# Patient Record
Sex: Female | Born: 2000 | Race: Black or African American | Hispanic: No | Marital: Single | Smoking: Never smoker
Health system: Southern US, Community
[De-identification: ages and names within clinical notes are randomized; demographics above are authoritative.]

## PROBLEM LIST (undated history)

## (undated) DIAGNOSIS — R519 Headache, unspecified: Secondary | ICD-10-CM

## (undated) DIAGNOSIS — L509 Urticaria, unspecified: Secondary | ICD-10-CM

## (undated) DIAGNOSIS — L709 Acne, unspecified: Secondary | ICD-10-CM

## (undated) DIAGNOSIS — G43909 Migraine, unspecified, not intractable, without status migrainosus: Secondary | ICD-10-CM

## (undated) DIAGNOSIS — Q699 Polydactyly, unspecified: Secondary | ICD-10-CM

## (undated) DIAGNOSIS — G8929 Other chronic pain: Secondary | ICD-10-CM

## (undated) HISTORY — DX: Migraine, unspecified, not intractable, without status migrainosus: G43.909

## (undated) HISTORY — DX: Headache, unspecified: R51.9

## (undated) HISTORY — DX: Other chronic pain: G89.29

## (undated) HISTORY — DX: Urticaria, unspecified: L50.9

---
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
## 2018-08-27 ENCOUNTER — Other Ambulatory Visit: Payer: Self-pay | Admitting: *Deleted

## 2018-08-27 MED ORDER — LEVOCETIRIZINE DIHYDROCHLORIDE 5 MG PO TABS: 5.0000 mg | ORAL_TABLET | Freq: Every evening | ORAL | 0 refills | Status: DC

## 2018-09-17 ENCOUNTER — Ambulatory Visit: Payer: 59 | Admitting: Emergency Medicine

## 2018-09-17 ENCOUNTER — Other Ambulatory Visit: Payer: Self-pay

## 2018-09-17 ENCOUNTER — Encounter: Payer: Self-pay | Admitting: Emergency Medicine

## 2018-09-17 VITALS — BP 139/89 | HR 147 | Temp 97.6°F | Resp 16 | Ht 67.25 in | Wt 201.8 lb

## 2018-09-17 DIAGNOSIS — K219 Gastro-esophageal reflux disease without esophagitis: Secondary | ICD-10-CM | POA: Diagnosis not present

## 2018-09-17 DIAGNOSIS — M549 Dorsalgia, unspecified: Secondary | ICD-10-CM | POA: Diagnosis not present

## 2018-09-17 DIAGNOSIS — Z23 Encounter for immunization: Secondary | ICD-10-CM | POA: Diagnosis not present

## 2018-09-17 MED ORDER — FAMOTIDINE 40 MG PO TABS: 40.0000 mg | ORAL_TABLET | Freq: Every day | ORAL | 1 refills | Status: DC

## 2018-09-17 MED ORDER — OMEPRAZOLE 40 MG PO CPDR: 40.0000 mg | DELAYED_RELEASE_CAPSULE | Freq: Every day | ORAL | 3 refills | Status: DC

## 2018-09-17 NOTE — Patient Instructions (Addendum)
   If you have lab work done today you will be contacted with your lab results within the next 2 weeks.  If you have not heard from us then please contact us. The fastest way to get your results is to register for My Chart.   IF you received an x-ray today, you will receive an invoice from [facility]. Please contact [facility] at [phone] with questions or concerns regarding your invoice.   IF you received labwork today, you will receive an invoice from [facility]. Please contact [facility] at [phone] with questions or concerns regarding your invoice.   Our billing staff will not be able to assist you with questions regarding bills from these companies.  You will be contacted with the lab results as soon as they are available. The fastest way to get your results is to activate your My Chart account. Instructions are located on the last page of this paperwork. If you have not heard from us regarding the results in 2 weeks, please contact this office.     Food Choices for Gastroesophageal Reflux Disease, Adult When you have gastroesophageal reflux disease (GERD), the foods you eat and your eating habits are very important. Choosing the right foods can help ease your discomfort. Think about working with a nutrition specialist (dietitian) to help you make good choices. What are tips for following this plan?  Meals  Choose healthy foods that are low in fat, such as fruits, vegetables, whole grains, low-fat dairy products, and lean meat, fish, and poultry.  Eat small meals often instead of 3 large meals a day. Eat your meals slowly, and in a place where you are relaxed. Avoid bending over or lying down until 2-3 hours after eating.  Avoid eating meals 2-3 hours before bed.  Avoid drinking a lot of liquid with meals.  Cook foods using methods other than frying. Bake, grill, or broil food instead.  Avoid or limit: ? Chocolate. ? Peppermint or  spearmint. ? Alcohol. ? Pepper. ? Black and decaffeinated coffee. ? Black and decaffeinated tea. ? Bubbly (carbonated) soft drinks. ? Caffeinated energy drinks and soft drinks.  Limit high-fat foods such as: ? Fatty meat or fried foods. ? Whole milk, cream, butter, or ice cream. ? Nuts and nut butters. ? Pastries, donuts, and sweets made with butter or shortening.  Avoid foods that cause symptoms. These foods may be different for everyone. Common foods that cause symptoms include: ? Tomatoes. ? Oranges, lemons, and limes. ? Peppers. ? Spicy food. ? Onions and garlic. ? Vinegar. Lifestyle  Maintain a healthy weight. Ask your doctor what weight is healthy for you. If you need to lose weight, work with your doctor to do so safely.  Exercise for at least 30 minutes for 5 or more days each week, or as told by your doctor.  Wear loose-fitting clothes.  Do not smoke. If you need help quitting, ask your doctor.  Sleep with the head of your bed higher than your feet. Use a wedge under the mattress or blocks under the bed frame to raise the head of the bed. Summary  When you have gastroesophageal reflux disease (GERD), food and lifestyle choices are very important in easing your symptoms.  Eat small meals often instead of 3 large meals a day. Eat your meals slowly, and in a place where you are relaxed.  Limit high-fat foods such as fatty meat or fried foods.  Avoid bending over or lying down until 2-3 hours after   eating.  Avoid peppermint and spearmint, caffeine, alcohol, and chocolate. This information is not intended to replace advice given to you by your health care provider. Make sure you discuss any questions you have with your health care provider. Document Released: 10/04/2011 Document Revised: 05/10/2016 Document Reviewed: 05/10/2016 Elsevier Interactive Patient Education  2019 [employer].

## 2018-09-17 NOTE — Progress Notes (Signed)
[address] 18 y.o.   Chief Complaint  Patient presents with  . Heartburn    x 1 month with upper back pain     HISTORY OF PRESENT ILLNESS: [name] complaining of chronic intermittent heartburn symptoms since last summer.  ED visit last July for GERD symptoms, started on omeprazole and Zantac.  Did well.  Symptoms recently started complaining of burning epigastric pain traveling up into her throat area.  Recently started on omeprazole 20 mg twice a day again with symptoms improvement but not completely gone.  Also has a history of seasonal allergies with chronic congestion .  Has been taking Allegra-D and nasal spray.  Denies any other significant symptoms.  Prior to symptom development she had been taking green tea which was felt to aggravate her symptoms.  Better since stopping.  Denies melena or rectal bleeding.  Able to drink and eat okay.  Denies nausea or vomiting.  Denies fever or flulike symptoms.  At times feels like heartburn pain radiates into her back.  Back pain is sharp and intermittent, worse with movement, at times exacerbated by working out.  HPI   Prior to Admission medications   Medication Sig Start Date End Date Taking? Authorizing Provider  levocetirizine (XYZAL) 5 MG tablet Take 1 tablet (5 mg total) by mouth every evening. 08/27/18  Yes Alfonse Spruce, MD  omeprazole (PRILOSEC) 20 MG capsule Take 1 capsule (20 mg total) by mouth daily. 10/25/17  Yes Wurst, [place], PA-C  albuterol (PROAIR HFA) 108 (90 Base) MCG/ACT inhaler Inhale 2 puffs into the lungs every 4 (four) hours as needed for wheezing or shortness of breath. Patient not taking: Reported on 09/17/2018 07/03/17   Alfonse Spruce, MD  ondansetron (ZOFRAN) 4 MG tablet Take 1 tablet (4 mg total) by mouth every 6 (six) hours. Patient not taking: Reported on 09/17/2018 10/25/17   Rennis Harding, PA-C    Allergies  Allergen Reactions  . Penicillins Hives and Shortness Of Breath     Patient Active Problem List   Diagnosis Date Noted  . Exercise-induced bronchospasm 06/30/2017  . Chronic urticaria 06/30/2017  . Seasonal and perennial allergic rhinitis 06/30/2017  . Frequent headaches 10/20/2013    Past Medical History:  Diagnosis Date  . Acne   . 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 Migraine   . Migraines   . Urticaria     Past Surgical History:  Procedure Laterality Date  . 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 FOOT SURGERY Right 2016    Social History   Socioeconomic History  . Marital status: Single    Spouse name: n/a  . Number of children: 0  . Years of education: Not on file  . Highest education level: Not on file  Occupational History  . 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 Financial resource strain: Not on file  . Food insecurity:    Worry: Not on file    Inability: Not on file  . Transportation needs:    Medical: Not on file    Non-medical: Not on file  Tobacco Use  . Smoking status: Never Smoker  . Smokeless tobacco: Never Used  . Tobacco comment: paternal grandmother smokes inside - pt. goes to grandmother's house before and after school  Substance and Sexual Activity  . Alcohol use: No  . Drug use: No  . Sexual activity: Not on file  Lifestyle  . Physical activity:    Days per week: Not on file    Minutes per session: Not on file  . Stress: Not on file  Relationships  . Social connections:    Talks on phone: Not on file    Gets together: Not on file    Attends religious service: Not on file    Active member of club or organization: Not on file    Attends meetings of clubs or organizations: Not on file    Relationship status: Not on file  . Intimate partner violence:    Fear of current or ex partner: Not on file     Emotionally abused: Not on file    Physically abused: Not on file    Forced sexual activity: Not on file  Other Topics Concern  . Not on file  Social History Narrative  . Not on file    Family History  Problem Relation Age of Onset  . Diabetes Maternal Grandmother   . Lung cancer Maternal Grandmother   . Diabetes Paternal Grandfather   . Kidney disease Paternal Grandfather   . Heart disease Maternal Grandfather   . Hypertension Paternal Grandmother      Review of Systems  Constitutional: Negative.  Negative for chills, fever and weight loss.  HENT: Positive for congestion. Negative for sore throat.   Respiratory: Negative.  Negative for cough and shortness of breath.   Cardiovascular: Negative for chest pain and palpitations.  Gastrointestinal: Positive for heartburn. Negative for abdominal pain, blood in stool, diarrhea, melena, nausea and vomiting.  Genitourinary: Negative.  Negative for dysuria.  Musculoskeletal: Negative.  Negative for myalgias.  Skin: Negative.  Negative for rash.  Neurological: Negative.  Negative for dizziness and headaches.  Endo/Heme/Allergies: Negative.   All other systems reviewed and are negative.  Vitals:   09/17/18 0832  BP: 139/89  Pulse: (!) 147  Resp: 16  Temp: 97.6 F (36.4 C)  SpO2: 98%   Repeat heart rate 100 and regular.  Physical Exam Vitals signs reviewed.  Constitutional:      Appearance: Normal appearance.  HENT:     Head: Normocephalic and atraumatic.     Nose: Nose normal.     Mouth/Throat:     Mouth: Mucous membranes are moist.     Pharynx: Oropharynx is clear.  Eyes:     Extraocular Movements: Extraocular movements intact.     Conjunctiva/sclera: Conjunctivae normal.     Pupils: Pupils are equal, round, and reactive to light.  Neck:     Musculoskeletal: Normal range of motion and neck supple.  Cardiovascular:     Rate and Rhythm: Normal rate and regular rhythm.     Pulses: Normal pulses.     Heart sounds:  Normal heart sounds.  Pulmonary:     Effort: Pulmonary effort is normal.     Breath sounds: Normal breath sounds.  Abdominal:     General: There is no distension.     Palpations: Abdomen is soft. There is no mass.     Tenderness: There is no abdominal tenderness. There is no guarding.  Musculoskeletal: Normal range of motion.        General: No swelling or tenderness.     Cervical back: Normal.     Thoracic back: Normal.     Lumbar back: Normal.     Right lower leg:  No edema.     Left lower leg: No edema.  Skin:    General: Skin is warm and dry.     Capillary Refill: Capillary refill takes less than 2 seconds.  Neurological:     General: No focal deficit present.     Mental Status: She is alert and oriented to person, place, and time.     Sensory: No sensory deficit.     Motor: No weakness.  Psychiatric:        Mood and Affect: Mood normal.        Behavior: Behavior normal.      ASSESSMENT & PLAN: Donna Cabrera was seen today for heartburn.  Diagnoses and all orders for this visit:  Gastroesophageal reflux disease without esophagitis -     omeprazole (PRILOSEC) 40 MG capsule; Take 1 capsule (40 mg total) by mouth daily. -     famotidine (PEPCID) 40 MG tablet; Take 1 tablet (40 mg total) by mouth daily for 14 days.  Need for prophylactic vaccination and inoculation against cholera alone -     Tdap vaccine greater than or equal to 7yo IM  Upper back pain Comments: Musculoskeletal    Patient Instructions       If you have lab work done today you will be contacted with your lab results within the next 2 weeks.  If you have not heard from us then please contact us. The fastest way to get your results is to register for My Chart.   IF you received an x-ray today, you will receive an invoice [facility]. Please contact [facility] at [phone] with questions or concerns regarding your invoice.   IF you received labwork today, you will receive an  invoice from [facility]. Please contact [facility] at [phone] with questions or concerns regarding your invoice.   Our billing staff will not be able to assist you with questions regarding bills from these companies.  You will be contacted with the lab results as soon as they are available. The fastest way to get your results is to activate your My Chart account. Instructions are located on the last page of this paperwork. If you have not heard from us regarding the results in 2 weeks, please contact this office.     Food Choices for Gastroesophageal Reflux Disease, Adult When you have gastroesophageal reflux disease (GERD), the foods you eat and your eating habits are very important. Choosing the right foods can help ease your discomfort. Think about working with a nutrition specialist (dietitian) to help you make good choices. What are tips for following this plan?  Meals  Choose healthy foods that are low in fat, such as fruits, vegetables, whole grains, low-fat dairy products, and lean meat, fish, and poultry.  Eat small meals often instead of 3 large meals a day. Eat your meals slowly, and in a place where you are relaxed. Avoid bending over or lying down until 2-3 hours after eating.  Avoid eating meals 2-3 hours before bed.  Avoid drinking a lot of liquid with meals.  Cook foods using methods other than frying. Bake, grill, or broil food instead.  Avoid or limit: ? Chocolate. ? Peppermint or spearmint. ? Alcohol. ? Pepper. ? Black and decaffeinated coffee. ? Black and decaffeinated tea. ? Bubbly (carbonated) soft drinks. ? Caffeinated energy drinks and soft drinks.  Limit high-fat foods such as: ? Fatty meat or fried foods. ? Whole milk, cream, butter, or ice cream. ? Nuts and nut butters. ?  Pastries, donuts, and sweets made with butter or shortening.  Avoid foods that cause symptoms. These foods may be different for everyone. Common foods that cause symptoms include:  ? Tomatoes. ? Oranges, lemons, and limes. ? Peppers. ? Spicy food. ? Onions and garlic. ? Vinegar. Lifestyle  Maintain a healthy weight. Ask your doctor what weight is healthy for you. If you need to lose weight, work with your doctor to do so safely.  Exercise for at least 30 minutes for 5 or more days each week, or as told by your doctor.  Wear loose-fitting clothes.  Do not smoke. If you need help quitting, ask your doctor.  Sleep with the head of your bed higher than your feet. Use a wedge under the mattress or blocks under the bed frame to raise the head of the bed. Summary  When you have gastroesophageal reflux disease (GERD), food and lifestyle choices are very important in easing your symptoms.  Eat small meals often instead of 3 large meals a day. Eat your meals slowly, and in a place where you are relaxed.  Limit high-fat foods such as fatty meat or fried foods.  Avoid bending over or lying down until 2-3 hours after eating.  Avoid peppermint and spearmint, caffeine, alcohol, and chocolate. This information is not intended to replace advice given to you by your health care provider. Make sure you discuss any questions you have with your health care provider. Document Released: 10/04/2011 Document Revised: 05/10/2016 Document Reviewed: 05/10/2016 Elsevier Interactive Patient Education  2019 [employer]      Edwina Barth, MD [facility]

## 2018-09-19 ENCOUNTER — Other Ambulatory Visit: Payer: Self-pay | Admitting: Allergy & Immunology

## 2018-11-01 ENCOUNTER — Encounter: Payer: Self-pay | Admitting: Allergy & Immunology

## 2018-11-01 ENCOUNTER — Ambulatory Visit: Payer: 59 | Admitting: Allergy & Immunology

## 2018-11-01 ENCOUNTER — Other Ambulatory Visit: Payer: Self-pay

## 2018-11-01 VITALS — BP 110/80 | HR 116 | Temp 97.3°F | Resp 16 | Ht 66.0 in | Wt 198.0 lb

## 2018-11-01 DIAGNOSIS — J45990 Exercise induced bronchospasm: Secondary | ICD-10-CM

## 2018-11-01 DIAGNOSIS — J302 Other seasonal allergic rhinitis: Secondary | ICD-10-CM

## 2018-11-01 DIAGNOSIS — T7800XD Anaphylactic reaction due to unspecified food, subsequent encounter: Secondary | ICD-10-CM | POA: Diagnosis not present

## 2018-11-01 DIAGNOSIS — K219 Gastro-esophageal reflux disease without esophagitis: Secondary | ICD-10-CM

## 2018-11-01 DIAGNOSIS — J3089 Other allergic rhinitis: Secondary | ICD-10-CM

## 2018-11-01 DIAGNOSIS — L508 Other urticaria: Secondary | ICD-10-CM

## 2018-11-01 MED ORDER — ALBUTEROL SULFATE HFA 108 (90 BASE) MCG/ACT IN AERS: 2.0000 | INHALATION_SPRAY | RESPIRATORY_TRACT | 1 refills | Status: DC | PRN

## 2018-11-01 MED ORDER — LEVOCETIRIZINE DIHYDROCHLORIDE 5 MG PO TABS: 5.0000 mg | ORAL_TABLET | Freq: Every evening | ORAL | 0 refills | Status: DC

## 2018-11-01 MED ORDER — MONTELUKAST SODIUM 10 MG PO TABS: 10.0000 mg | ORAL_TABLET | Freq: Every day | ORAL | 5 refills | Status: DC

## 2018-11-01 NOTE — Progress Notes (Signed)
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 Exercise-induced bronchospasm - Lung testing looked good today.  - We are going to restart the montelukast to see if this will help with the shortness of breath.  - - Daily controller medication(s): Singulair 10mg  daily - Prior to physical activity: ProAir 2 puffs 10-15 minutes before physical activity. - Rescue medications: ProAir 4 puffs every 4-6 hours as needed - Asthma control goals:  * Full participation in all desired activities (may need albuterol before activity) * Albuterol use two time or less a week on average (not counting use with activity) * Cough interfering with sleep two time or less a month * Oral steroids no more than once a year * No hospitalizations   2. Chronic urticaria - controlled with antihistamines - Workup last year was normal aside from the tree nut allergies. - In the meantime, continue with suppressive dosing of antihistamines:   - Morning: Xyzal (levocetirizine) 5mg  (one tablets)  - Evening: Zyrtec (cetirizine) 10mg  (one tablets) - You can change this dosing at home, decreasing the dose as needed or increasing the dosing as needed.  - If you are not tolerating the medications or are tired of taking them every day, we can start treatment with a monthly injectable medication called Xolair.    3. Perennial and seasonal allergic rhinitis (trees, weeds, grasses, dust mites, cat and cockroach) - Your throat clearly showed uncontrolled postnasal drip. - Restarting the nose spray and being consistent with it will help.  - Allergy shots would also help ( - Continue taking: Nasacort (triamcinolone) one spray per nostril daily (plus the antihistamines for the  chronic hives) - You can use an extra dose of the antihistamine, if needed, for breakthrough symptoms.  - Consider nasal saline rinses 1-2 times daily to remove allergens from the nasal cavities as well as help with mucous clearance (this is especially helpful to do before the nasal sprays are given) - Consider allergy shots as a means of long-term control. - Allergy shots "re-train" and "reset" the immune system to ignore environmental allergens and decrease the resulting immune response to those allergens (sneezing, itchy watery eyes, runny nose, nasal congestion, etc).    - Allergy shots improve symptoms in 75-85% of patients.   4. Chest discomfort  - I would definitely make an appointment with your GI doctor again. - You could have something called eosinophilic esophagitis (information provided). - The thing that makes me concerned with this is the fact that your symptoms are consistently during the same time of the year.   5. Return in about 2 months (around 01/02/2019). This can be an in-person, a virtual Webex or a telephone follow up visit.   Subjective:   Donna Cabrera is a 18 y.o. female presenting today for follow up of  Chief Complaint  Patient presents with   Urticaria    broke out last week, after being outside   Exercise induced bronchospasm    having issues when the weather quality is bad, chest feels tight, dust issues    Donna Cabrera has a history of the following: Patient Active Problem List   Diagnosis Date Noted   Exercise-induced bronchospasm 06/30/2017   Chronic urticaria 06/30/2017   Seasonal and perennial allergic rhinitis 06/30/2017  Frequent headaches 10/20/2013    History obtained from: chart review and patient.  Donna Cabrera is a [age] y.o. female presenting for a follow up visit.  She was last seen in March 2019 as a new patient.  At that time, her lung testing looked great.  We continued with albuterol as needed and added Singulair on to help with her  exercise-induced bronchospasm.  For her chronic urticaria, we did obtain labs to rule out serious causes of hives and swelling.  We started Xyzal 2 tablets in the morning and Zyrtec 2 tablets at night.  We also added on Zantac in the evening.  She had testing that was positive to trees, weeds, grasses, dust mite, cat, and cockroach.  We started her on Nasacort 1 spray per nostril daily.  She was supposed to return in 3 months, but presents now later.  Since the last visit, she has mostly done well.   Asthma Symptom History: Her asthma has been under fairly good control.  She was taking her montelukast on a regular basis as prescribed at the last visit.  However, at some point she ran out and no longer had any more refills.  She did notice that chest tightness worsened when she was off of her montelukast.  She does need both a refill for the montelukast and the albuterol.  She has not been on prednisone for her asthma.  ACT score is 22, indicating excellent asthma control.  Allergic Rhinitis Symptom History: For the rhinitis, she remains on the triamcinolone 1 spray per nostril daily.  She does report some postnasal drip and throat clearing.  This seems to be fairly stable throughout the entirety of the year.  She has not required any antibiotics at all since last visit for sinus infections.  Food Allergy Symptom History: She continues to avoid tree nuts.  She does not eat a lot of red meat.  Her epinephrine autoinjector does need to be refilled.   Urticaria Symptom History: Her hives been under good control with 1 Xyzal in the morning and 1 Zyrtec at night.  She also reports that the montelukast at night seems to have helped as well.  She has avoided tree nuts since that came up positive in testing at the last visit.  The remainder of her lab work was unremarkable.  She has not needed to use an epinephrine autoinjector for any of the episodes.  She also has a history of GERD, although she has never seen  a gastroenterologist.  This was recommended when she went to urgent care shortly after visit a year ago.  Interestingly, the reflux symptoms seem to only occur between April and July.  The rest of the year it seems to be fairly stable.  She denies any history of food impaction.  She does not feel that she needs to drink a lot of water to get the food to go down.  She has gotten off of caffeine, both to help her headaches and incidentally to help her reflux.  This has helped.  She has never been scoped.  She is not taking a proton pump inhibitor at all.  Otherwise, there have been no changes to her past medical history, surgical history, family history, or social history.  She continues to major in psychology.    Review of Systems  Constitutional: Negative.  Negative for chills, fever, malaise/fatigue and weight loss.  HENT: Negative.  Negative for congestion, ear discharge, ear pain and sore throat.  Positive for postnasal drip.  Eyes: Negative for pain, discharge and redness.  Respiratory: Negative for cough, sputum production, shortness of breath and wheezing.   Cardiovascular: Negative.  Negative for chest pain and palpitations.  Gastrointestinal: Positive for heartburn and nausea. Negative for abdominal pain and vomiting.  Skin: Negative.  Negative for itching and rash.  Neurological: Negative for dizziness and headaches.  Endo/Heme/Allergies: Negative for environmental allergies. Does not bruise/bleed easily.       Objective:   Blood pressure 110/80, pulse (!) 116, temperature (!) 97.3 F (36.3 C), temperature source Temporal, resp. rate 16, height 5\' 6"  (1.676 m), weight 198 lb (89.8 kg), SpO2 96 %. Body mass index is 31.96 kg/m.   Physical Exam:  Physical Exam  Constitutional: She appears well-developed.  Very pleasant female.  HENT:  Head: Normocephalic and atraumatic.  Right Ear: Tympanic membrane, external ear and ear canal normal.  Left Ear: Tympanic membrane,  external ear and ear canal normal.  Nose: Mucosal edema and rhinorrhea present. No nasal deformity or septal deviation. No epistaxis. Right sinus exhibits no maxillary sinus tenderness and no frontal sinus tenderness. Left sinus exhibits no maxillary sinus tenderness and no frontal sinus tenderness.  Mouth/Throat: Uvula is midline and oropharynx is clear and moist. Mucous membranes are not pale and not dry.  Marked cobblestoning of the posterior oropharynx.  The posterior oropharynx is also rather erythematous.  Eyes: Pupils are equal, round, and reactive to light. Conjunctivae and EOM are normal. Right eye exhibits no chemosis and no discharge. Left eye exhibits no chemosis and no discharge. Right conjunctiva is not injected. Left conjunctiva is not injected.  Allergic shiners bilaterally.  Cardiovascular: Normal rate, regular rhythm and normal heart sounds.  Respiratory: Effort normal and breath sounds normal. No accessory muscle usage. No tachypnea. No respiratory distress. She has no wheezes. She has no rhonchi. She has no rales. She exhibits no tenderness.  Moving air well in all lung fields.  Lymphadenopathy:    She has no cervical adenopathy.  Neurological: She is alert.  Skin: No abrasion, no petechiae and no rash noted. Rash is not papular, not vesicular and not urticarial. No erythema. No pallor.  No eczematous or urticarial lesions noted.  Psychiatric: She has a normal mood and affect.     Diagnostic studies:    Spirometry: results normal (FEV1: 2.41/77%, FVC: 2.96/84%, FEV1/FVC: 81%).    Spirometry consistent with normal pattern.   Allergy Studies: none       Salvatore Marvel, MD  [facility] [place]

## 2018-11-01 NOTE — Patient Instructions (Addendum)
1. Exercise-induced bronchospasm - Lung testing looked good today.  - We are going to restart the montelukast to see if this will help with the shortness of breath.  - - Daily controller medication(s): Singulair 10mg  daily - Prior to physical activity: ProAir 2 puffs 10-15 minutes before physical activity. - Rescue medications: ProAir 4 puffs every 4-6 hours as needed - Asthma control goals:  * Full participation in all desired activities (may need albuterol before activity) * Albuterol use two time or less a week on average (not counting use with activity) * Cough interfering with sleep two time or less a month * Oral steroids no more than once a year * No hospitalizations   2. Chronic urticaria - controlled with antihistamines - Workup last year was normal aside from the tree nut allergies. - In the meantime, continue with suppressive dosing of antihistamines:   - Morning: Xyzal (levocetirizine) 5mg  (one tablets)  - Evening: Zyrtec (cetirizine) 10mg  (one tablets) - You can change this dosing at home, decreasing the dose as needed or increasing the dosing as needed.  - If you are not tolerating the medications or are tired of taking them every day, we can start treatment with a monthly injectable medication called Xolair.    3. Perennial and seasonal allergic rhinitis (trees, weeds, grasses, dust mites, cat and cockroach) - Your throat clearly showed uncontrolled postnasal drip. - Restarting the nose spray and being consistent with it will help.  - Allergy shots would also help ( - Continue taking: Nasacort (triamcinolone) one spray per nostril daily (plus the antihistamines for the chronic hives) - You can use an extra dose of the antihistamine, if needed, for breakthrough symptoms.  - Consider nasal saline rinses 1-2 times daily to remove allergens from the nasal cavities as well as help with mucous clearance (this is especially helpful to do before the nasal sprays are given) -  Consider allergy shots as a means of long-term control. - Allergy shots "re-train" and "reset" the immune system to ignore environmental allergens and decrease the resulting immune response to those allergens (sneezing, itchy watery eyes, runny nose, nasal congestion, etc).    - Allergy shots improve symptoms in 75-85% of patients.   4. Chest discomfort  - I would definitely make an appointment with your GI doctor again. - You could have something called eosinophilic esophagitis (information provided). - The thing that makes me concerned with this is the fact that your symptoms are consistently during the same time of the year.   5. Return in about 2 months (around 01/02/2019). This can be an in-person, a virtual Webex or a telephone follow up visit.   Please inform us of any Emergency Department visits, hospitalizations, or changes in symptoms. Call us before going to the ED for breathing or allergy symptoms since we might be able to fit you in for a sick visit. Feel free to contact us anytime with any questions, problems, or concerns.  It was a pleasure to see you again today!  Websites that have reliable patient information: 1. American Academy of Asthma, Allergy, and Immunology: www.aaaai.org 2. Food Allergy Research and Education (FARE): foodallergy.org 3. Mothers of Asthmatics: http://www.asthmacommunitynetwork.org 4. American College of Allergy, Asthma, and Immunology: www.acaai.org  "Like" us on Facebook and Instagram for our latest updates!      Make sure you are registered to vote! If you have moved or changed any of your contact information, you will need to get this updated before voting!  In  some cases, you MAY be able to register to vote online: [email or web address]    Voter ID laws are NOT going into effect for the General Election in November 2020! DO NOT let this stop you from exercising your right to vote!   Absentee voting is the SAFEST  way to vote during the coronavirus pandemic!   Download and print an absentee ballot request form at [email or web address] or you can scan the QR code below with your smart phone:      More information on absentee ballots can be found here: [email or web address]  Allergy Shots   Allergies are the result of a chain reaction that starts in the immune system. Your immune system controls how your body defends itself. For instance, if you have an allergy to pollen, your immune system identifies pollen as an invader or allergen. Your immune system overreacts by producing antibodies called Immunoglobulin E (IgE). These antibodies travel to cells that release chemicals, causing an allergic reaction.  The concept behind allergy immunotherapy, whether it is received in the form of shots or tablets, is that the immune system can be desensitized to specific allergens that trigger allergy symptoms. Although it requires time and patience, the payback can be long-term relief.  How Do Allergy Shots Work?  Allergy shots work much like a vaccine. Your body responds to injected amounts of a particular allergen given in increasing doses, eventually developing a resistance and tolerance to it. Allergy shots can lead to decreased, minimal or no allergy symptoms.  There generally are two phases: build-up and maintenance. Build-up often ranges from three to six months and involves receiving injections with increasing amounts of the allergens. The shots are typically given once or twice a week, though more rapid build-up schedules are sometimes used.  The maintenance phase begins when the most effective dose is reached. This dose is different for each person, depending on how allergic you are and your response to the build-up injections. Once the maintenance dose is reached, there are longer periods between injections, typically two to four weeks.  Occasionally doctors give cortisone-type shots that  can temporarily reduce allergy symptoms. These types of shots are different and should not be confused with allergy immunotherapy shots.  Who Can Be Treated with Allergy Shots?  Allergy shots may be a good treatment approach for people with allergic rhinitis (hay fever), allergic asthma, conjunctivitis (eye allergy) or stinging insect allergy.   Before deciding to begin allergy shots, you should consider:  . The length of allergy season and the severity of your symptoms . Whether medications and/or changes to your environment can control your symptoms . Your desire to avoid long-term medication use . Time: allergy immunotherapy requires a major time commitment . Cost: may vary depending on your insurance coverage  Allergy shots for children age 25 and older are effective and often well tolerated. They might prevent the onset of new allergen sensitivities or the progression to asthma.  Allergy shots are not started on patients who are pregnant but can be continued on patients who become pregnant while receiving them. In some patients with other medical conditions or who take certain common medications, allergy shots may be of risk. It is important to mention other medications you talk to your allergist.   When Will I Feel Better?  Some may experience decreased allergy symptoms during the build-up phase. For others, it may take as long as 12 months on the maintenance dose. If there is no improvement after a year of maintenance, your  allergist will discuss other treatment options with you.  If you aren't responding to allergy shots, it may be because there is not enough dose of the allergen in your vaccine or there are missing allergens that were not identified during your allergy testing. Other reasons could be that there are high levels of the allergen in your environment or major exposure to non-allergic triggers like tobacco smoke.  What Is the Length of Treatment?  Once the maintenance  dose is reached, allergy shots are generally continued for three to five years. The decision to stop should be discussed with your allergist at that time. Some people may experience a permanent reduction of allergy symptoms. Others may relapse and a longer course of allergy shots can be considered.  What Are the Possible Reactions?  The two types of adverse reactions that can occur with allergy shots are local and systemic. Common local reactions include very mild redness and swelling at the injection site, which can happen immediately or several hours after. A systemic reaction, which is less common, affects the entire body or a particular body system. They are usually mild and typically respond quickly to medications. Signs include increased allergy symptoms such as sneezing, a stuffy nose or hives.  Rarely, a serious systemic reaction called anaphylaxis can develop. Symptoms include swelling in the throat, wheezing, a feeling of tightness in the chest, nausea or dizziness. Most serious systemic reactions develop within 30 minutes of allergy shots. This is why it is strongly recommended you wait in your doctor's office for 30 minutes after your injections. Your allergist is trained to watch for reactions, and his or her staff is trained and equipped with the proper medications to identify and treat them.  Who Should Administer Allergy Shots?  The preferred location for receiving shots is your prescribing allergist's office. Injections can sometimes be given at another facility where the physician and staff are trained to recognize and treat reactions, and have received instructions by your prescribing allergist.

## 2018-11-02 ENCOUNTER — Encounter: Payer: Self-pay | Admitting: Allergy & Immunology

## 2018-11-06 ENCOUNTER — Telehealth: Payer: Self-pay

## 2018-11-06 NOTE — Telephone Encounter (Signed)
Thanks, Ms. Leonor Liv!   Salvatore Marvel, MD [facility] [place]

## 2018-11-06 NOTE — Telephone Encounter (Signed)
Patient is scheduled to see Dr  Tarri Glenn on 12/06/18 with [facility].   Thanks

## 2018-12-05 ENCOUNTER — Other Ambulatory Visit: Payer: Self-pay | Admitting: Allergy & Immunology

## 2018-12-06 ENCOUNTER — Ambulatory Visit: Payer: 59 | Admitting: Gastroenterology

## 2018-12-06 ENCOUNTER — Encounter: Payer: Self-pay | Admitting: Gastroenterology

## 2018-12-06 VITALS — BP 118/70 | HR 68 | Temp 98.0°F | Ht 66.0 in | Wt 194.0 lb

## 2018-12-06 DIAGNOSIS — K219 Gastro-esophageal reflux disease without esophagitis: Secondary | ICD-10-CM

## 2018-12-06 NOTE — Patient Instructions (Addendum)
I recommend increasing your omeprazole to 40 mg twice daily for the next 8 weeks.   Take your proton pump inhibitor medication 30 minutes before meals. Avoid any dietary triggers Avoid spicy and acidic foods Limit your intake of coffee, tea, alcohol, and carbonated drinks Keep the head of the bed elevated with blocks if you are having any nighttime symptoms Stay upright for 2 hours after eating Avoid meals and snacks three to four hours before bedtime  I am hopeful that your symptoms will improve with 8 weeks of aggressive treatment. If not, we should plan an upper endoscopy to evaluate for eosinophilic esophagitis. Please call at any time to proceed with scheduling the procedure if you aren't feeling better.

## 2018-12-06 NOTE — Progress Notes (Signed)
Referring Provider: Valentina Shaggy, * Primary Care Physician:  Horald Pollen, MD  Reason for Consultation:  GERD   IMPRESSION:  GERD without history of dysphagia Frequent headaches treated with Excedrin Tree nut allergy Perennial and seasonal allergic rhinitis Exercise inducted bronchospasm Chronic urticaria  Plan to maximize medical therapy of GERD. If not responding, will proceed with EGD with biopsies to exclude concurrent processes including eosinophilic esophagitis.    PLAN: Omeprazole 40 mg BID x 8 weeks Avoid all NSAIDs as you are able Reviewed reflux lifestyle modifications EGD with esophageal biopsies if not resolving  Please see the "Patient Instructions" section for addition details about the plan.  HPI: Donna Cabrera is a 18 y.o. female referred but her allergist, Dr. Ernst Bowler, for reflux.  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  The history is obtained through the patient and review of her electronic health record. Has a tree nut allergies.  Has anxiety, depression, and obesity.  Migraines evaluated at headache clinic. Treats her headaches with Excedrin migraine and Tylenol ES, requiring treatment 4 times each month.   Started last summer. Associated globus. Using omeprazole 20 mg for one month. Stopped taking I because she was feeling better. Resumed problems developed this spring. However, not responding to omeprazole. Dose increased to 40 mg and she was also treated with Pepcid for 14 days. She used the medication consistently at that time and symptoms improved. Asymptomatic for the subsequent 2-3 weeks. Currently using omeprazole PRN.  Frequent eructation. Constant globus. No dysphagia or odynophagia. No sore throat. No dysphonia.  Neck and shoulder pain.  Center back pain when her acid reflux issues. No nausea or vomiting. Initially had some nausea.  Discontinuing caffeine for her headaches incidentally helped her reflux.  Her allergist  expressed concern to her that her reflux primarily occurs between April and July and suggested that this could be eosinophilic esophagitis.  Had constipation last week that she treated with prune juice and laxatives resulting in diarrhea.  Or now.  Bloating has improved. No other change in bowel habits. No blood in the stool.   No prior endoscopic evaluation.   PGM with acid reflux previously treated with Zantac. No known family history of colon cancer or polyps. No family history of uterine/endometrial cancer, pancreatic cancer or gastric/stomach cancer.   Past Medical History:  Diagnosis Date  . Acne   . Chronic headaches   . 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 Migraine   . Migraines   . Urticaria     Past Surgical History:  Procedure Laterality Date  . 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 FOOT SURGERY Right 2016    Current Outpatient Medications  Medication Sig Dispense Refill  . albuterol (PROAIR HFA) 108 (90 Base) MCG/ACT inhaler Inhale 2 puffs into the lungs every 4 (four) hours as needed for wheezing or shortness of breath. 18 g 1  . Aspirin-Acetaminophen-Caffeine (EXCEDRIN MIGRAINE PO) Take by mouth as needed.    Marland Kitchen levocetirizine (XYZAL) 5 MG tablet TAKE 1 TABLET BY MOUTH EVERY DAY IN THE EVENING 30 tablet 5  . montelukast (SINGULAIR) 10 MG tablet Take 1 tablet (10 mg total) by mouth at bedtime. 30 tablet 5  . omeprazole (PRILOSEC) 40 MG capsule Take 1 capsule (40 mg total) by mouth daily. 30 capsule 3   No current  facility-administered medications for this visit.     Allergies as of 12/06/2018 - Review Complete 12/06/2018  Allergen Reaction Noted  . Penicillins Hives and Shortness Of Breath 10/20/2013    Family History  Problem Relation Age of Onset  . Diabetes Maternal Grandmother   . Lung cancer  Maternal Grandmother   . Diabetes Paternal Grandfather   . Kidney disease Paternal Grandfather   . Heart disease Maternal Grandfather   . Hypertension Paternal Grandmother   . Stomach cancer Neg Hx   . Pancreatic cancer Neg Hx   . Colon cancer Neg Hx     Social History   Socioeconomic History  . Marital status: Single    Spouse name: n/a  . Number of children: 0  . Years of education: Not on file  . Highest education level: Not on file  Occupational History  . Occupation: Consulting civil engineerstudent    Comment: [employer]  Social Needs  . Financial resource strain: Not on file  . Food insecurity    Worry: Not on file    Inability: Not on file  . Transportation needs    Medical: Not on file    Non-medical: Not on file  Tobacco Use  . Smoking status: Never Smoker  . Smokeless tobacco: Never Used  . Tobacco comment: paternal grandmother smokes inside - pt. goes to grandmother's house before and after school  Substance and Sexual Activity  . Alcohol use: No  . Drug use: No  . Sexual activity: Not on file  Lifestyle  . Physical activity    Days per week: Not on file    Minutes per session: Not on file  . Stress: Not on file  Relationships  . Social Musicianconnections    Talks on phone: Not on file    Gets together: Not on file    Attends religious service: Not on file    Active member of club or organization: Not on file    Attends meetings of clubs or organizations: Not on file    Relationship status: Not on file  . Intimate partner violence    Fear of current or ex partner: Not on file    Emotionally abused: Not on file    Physically abused: Not on file    Forced sexual activity: Not on file  Other Topics Concern  . Not on file  Social History Narrative  . Not on file    Review of Systems: 12 system ROS is negative except as noted above with the additions of allergy, anxiety, back pain, depression, fatigue, headaches, menstrual pain.   Physical Exam: General:    Alert,  well-nourished, pleasant and cooperative in NAD Head:  Normocephalic and atraumatic. Eyes:  Sclera clear, no icterus.   Conjunctiva pink. Ears:  Normal auditory acuity. Nose:  No deformity, discharge,  or lesions. Mouth:  No deformity or lesions.   Neck:  Supple; no masses or thyromegaly. Lungs:  Clear throughout to auscultation.   No wheezes. Heart:  Regular rate and rhythm; no murmurs. Abdomen:  Soft,nontender, nondistended, normal bowel sounds, no rebound or guarding. No hepatosplenomegaly.   Rectal:  Deferred  Msk:  Symmetrical. No boney deformities LAD: No inguinal or umbilical LAD Extremities:  No clubbing or edema. Neurologic:  Alert and  oriented x4;  grossly nonfocal Skin:  Intact without significant lesions or rashes. Psych:  Alert and cooperative. Normal mood and affect.   Keary Hanak L. Orvan FalconerBeavers, MD, MPH 12/06/2018, 10:26 AM

## 2019-01-03 ENCOUNTER — Ambulatory Visit: Payer: 59 | Admitting: Allergy & Immunology

## 2019-01-14 ENCOUNTER — Encounter: Payer: Self-pay | Admitting: *Deleted

## 2019-01-14 ENCOUNTER — Other Ambulatory Visit: Payer: Self-pay | Admitting: *Deleted

## 2019-01-14 DIAGNOSIS — K219 Gastro-esophageal reflux disease without esophagitis: Secondary | ICD-10-CM

## 2019-01-14 MED ORDER — OMEPRAZOLE 40 MG PO CPDR: 40.0000 mg | DELAYED_RELEASE_CAPSULE | Freq: Every day | ORAL | 3 refills | Status: AC

## 2019-03-18 ENCOUNTER — Encounter: Payer: Self-pay | Admitting: *Deleted

## 2019-05-24 ENCOUNTER — Other Ambulatory Visit: Payer: Self-pay | Admitting: Obstetrics and Gynecology

## 2019-05-24 DIAGNOSIS — N6452 Nipple discharge: Secondary | ICD-10-CM

## 2019-06-10 ENCOUNTER — Ambulatory Visit
Admission: RE | Admit: 2019-06-10 | Discharge: 2019-06-10 | Disposition: A | Discharge status: Home / Self Care | Payer: 59 | Source: Ambulatory Visit | Attending: Obstetrics and Gynecology | Admitting: Obstetrics and Gynecology

## 2019-06-10 ENCOUNTER — Other Ambulatory Visit: Payer: Self-pay

## 2019-06-10 DIAGNOSIS — N6452 Nipple discharge: Secondary | ICD-10-CM

## 2019-06-28 ENCOUNTER — Ambulatory Visit: Payer: 59 | Attending: Internal Medicine

## 2019-06-28 DIAGNOSIS — Z23 Encounter for immunization: Secondary | ICD-10-CM

## 2019-06-28 NOTE — Progress Notes (Signed)
   Covid-19 Vaccination Clinic  Name:  Donna Cabrera    MRN: [id number] DOB: Nov 01, 2000  06/28/2019  [name] was observed post Covid-19 immunization for 15 minutes without incident. She was provided with Vaccine Information Sheet and instruction to access the V-Safe system.   [name] was instructed to call 911 with any severe reactions post vaccine: Marland Kitchen Difficulty breathing  . Swelling of face and throat  . A fast heartbeat  . A bad rash all over body  . Dizziness and weakness   Immunizations Administered    Name Date Dose VIS Date Route   Pfizer COVID-19 Vaccine 06/28/2019 11:03 AM 0.3 mL 03/29/2019 Intramuscular   Manufacturer: [employer], [employer]   Lot: [id number]   NDC: [address]-3

## 2019-07-22 ENCOUNTER — Ambulatory Visit: Payer: 59

## 2019-07-24 ENCOUNTER — Ambulatory Visit: Payer: 59 | Attending: Internal Medicine

## 2019-07-24 DIAGNOSIS — Z23 Encounter for immunization: Secondary | ICD-10-CM

## 2019-07-24 NOTE — Progress Notes (Signed)
   Covid-19 Vaccination Clinic  Name:  Donna Cabrera    MRN: [id number] DOB: [birth year]  07/24/2019  [name] was observed post Covid-19 immunization for 15 minutes without incident. She was provided with Vaccine Information Sheet and instruction to access the V-Safe system.   [name] was instructed to call 911 with any severe reactions post vaccine: Marland Kitchen Difficulty breathing  . Swelling of face and throat  . A fast heartbeat  . A bad rash all over body  . Dizziness and weakness   Immunizations Administered    Name Date Dose VIS Date Route   Pfizer COVID-19 Vaccine 07/24/2019 10:33 AM 0.3 mL 03/29/2019 Intramuscular   Manufacturer: [employer], [employer]   Lot: [id number]   NDC: [address]-4

## 2020-03-18 HISTORY — PX: WISDOM TOOTH EXTRACTION: SHX21

## 2021-01-23 IMAGING — US US BREAST*L* LIMITED INC AXILLA
1 series · 4 of 4 positions shown · non-contrast
Comparison: None.

CLINICAL DATA: 18-year-old female presenting for concern of left
nipple discharge and pain in the upper-outer left breast. Before her
menstrual cycle about 1 month ago, the patient noticed a stain on
her bra on the left side. She did not see active discharge, and did
not notice any evidence of further discharge after she noticed
stain. During the menstrual cycle, she had significant pain
bilaterally, worst in the upper-outer quadrant of the left breast
which she describes as severe. The pain has since resolved.

EXAM:
ULTRASOUND OF THE LEFT BREAST

[Series 1: us breast*left* limited inc axilla · 0.07mm/px · 4 of 4 slices shown]
[im 1/4]
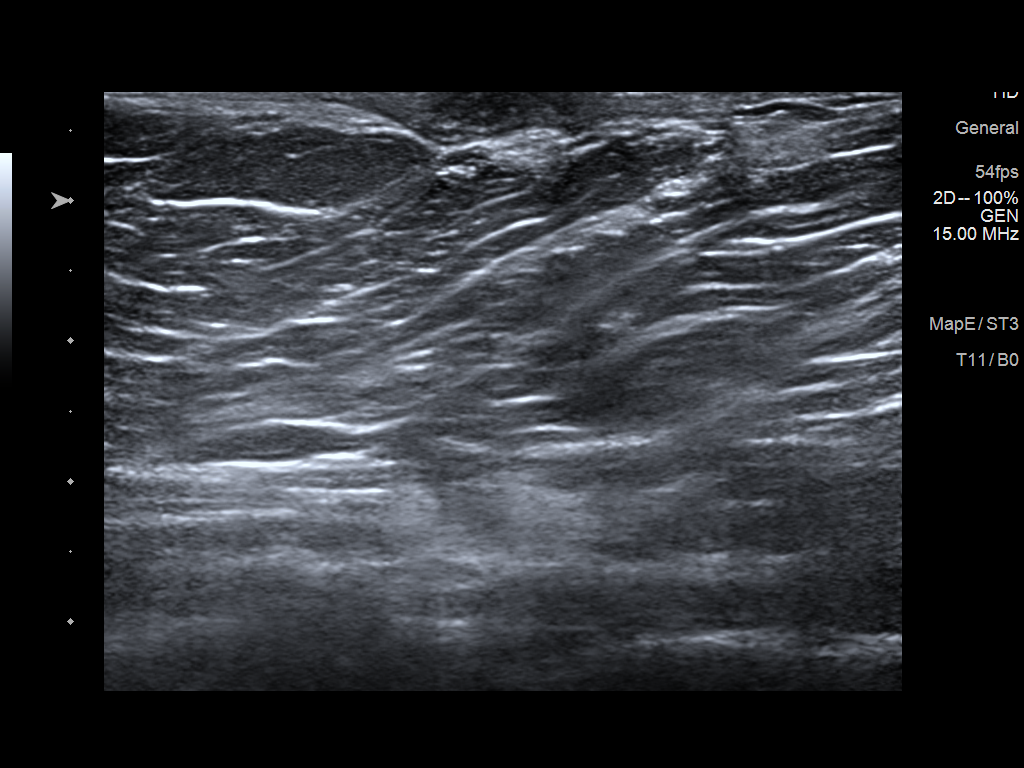
[im 2/4]
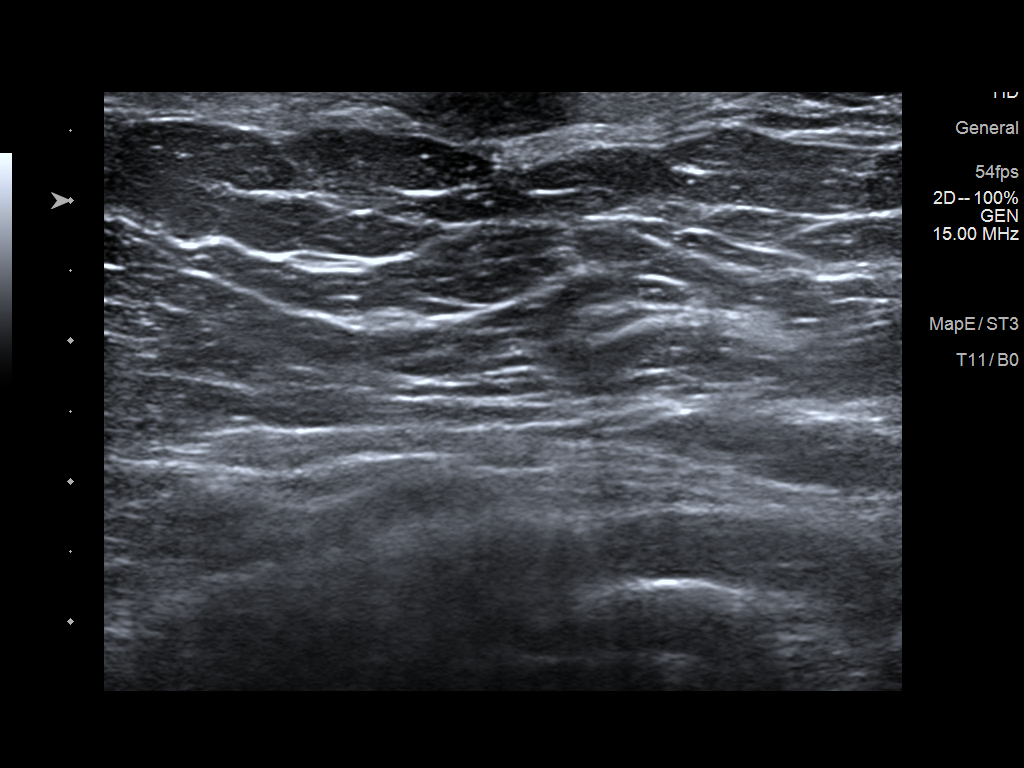
[im 3/4]
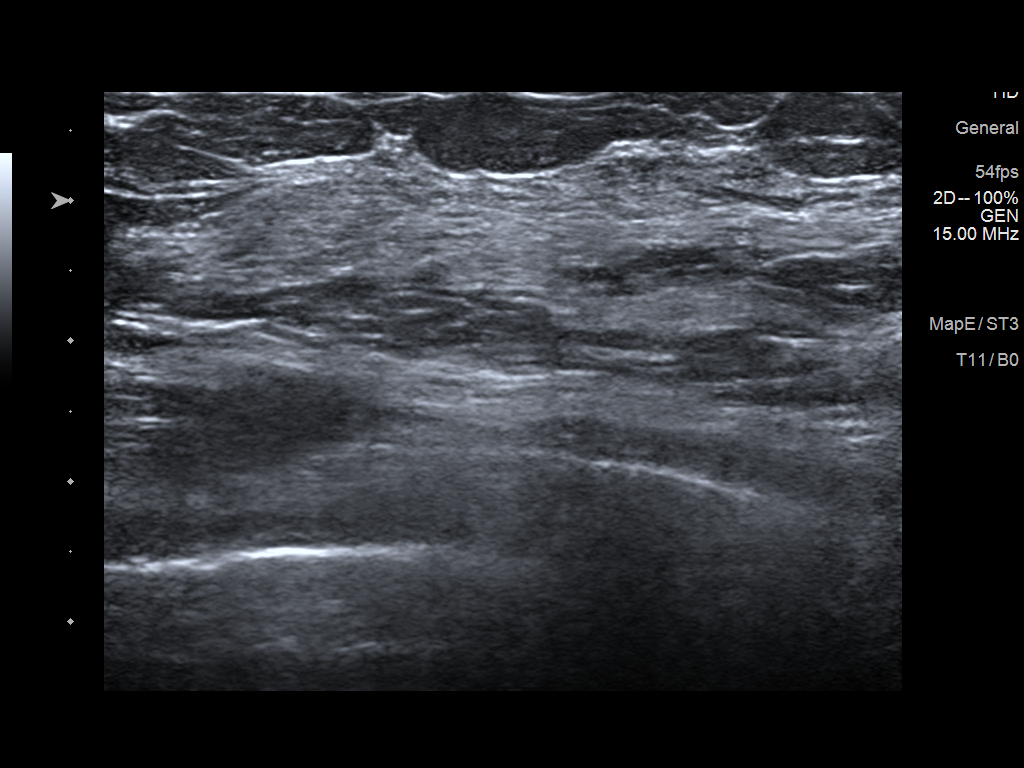
[im 4/4]
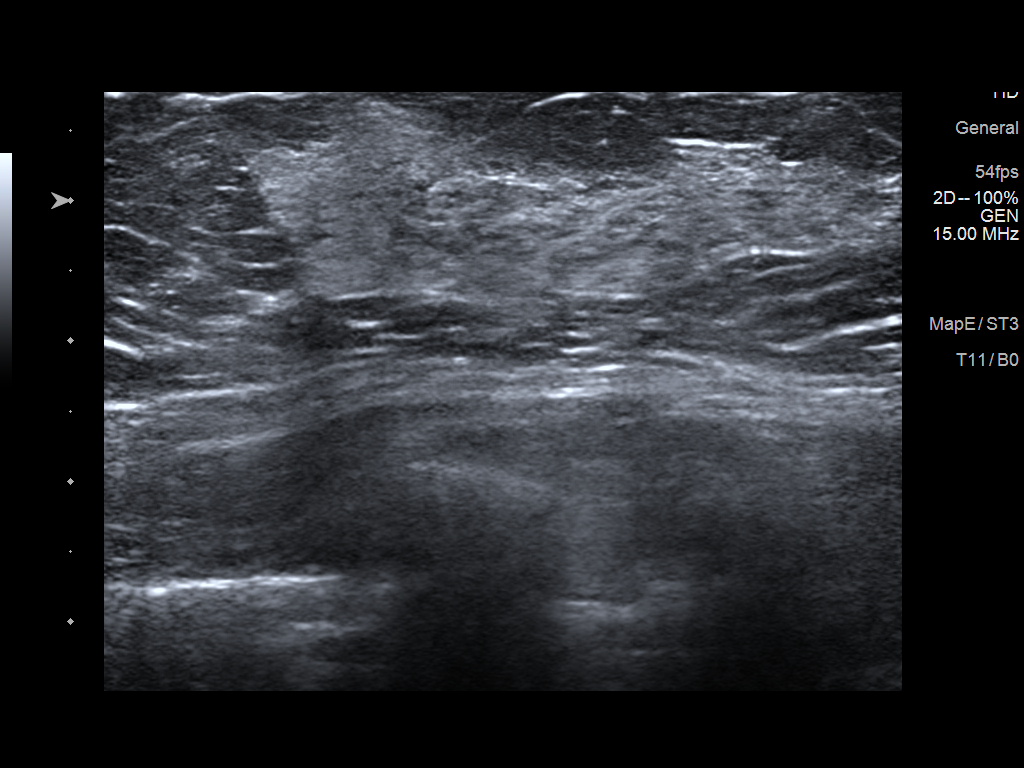

[4 of 4 positions shown; findings below may reference images not displayed]

FINDINGS: Ultrasound of the retroareolar left breast demonstrates normal
fibroglandular tissue. No dilated ducts, intraductal masses or
parenchymal masses are identified. Ultrasound of the upper-outer
quadrant of the left breast demonstrates normal fibroglandular
tissue. No suspicious masses or areas of shadowing are identified.
IMPRESSION: 1. Normal targeted sonographic ultrasound of the retroareolar left
breast and upper outer quadrant of the left breast.

RECOMMENDATION:
1. Further management of nipple discharge should be based on
clinical assessment. The patient was advised to alert her doctor if
the discharge recurs, and if she experiences spontaneous unilateral
bloody or clear nipple discharge from a single duct.

2. Screening mammogram at age 38 unless there are persistent or
intervening clinical concerns. (Code:[id number])

I have discussed the findings and recommendations with the patient.
If applicable, a reminder letter will be sent to the patient
regarding the next appointment.

BI-RADS CATEGORY  1: Negative.

## 2021-07-27 ENCOUNTER — Encounter: Payer: Self-pay | Admitting: Allergy & Immunology

## 2021-07-27 ENCOUNTER — Other Ambulatory Visit: Payer: Self-pay | Admitting: Allergy & Immunology

## 2021-07-27 ENCOUNTER — Ambulatory Visit (INDEPENDENT_AMBULATORY_CARE_PROVIDER_SITE_OTHER): Payer: BC Managed Care – PPO | Admitting: Allergy & Immunology

## 2021-07-27 VITALS — BP 130/72 | HR 107 | Temp 97.3°F | Resp 12 | Ht 66.0 in

## 2021-07-27 DIAGNOSIS — J302 Other seasonal allergic rhinitis: Secondary | ICD-10-CM

## 2021-07-27 DIAGNOSIS — L508 Other urticaria: Secondary | ICD-10-CM

## 2021-07-27 DIAGNOSIS — J3089 Other allergic rhinitis: Secondary | ICD-10-CM

## 2021-07-27 DIAGNOSIS — J45990 Exercise induced bronchospasm: Secondary | ICD-10-CM | POA: Diagnosis not present

## 2021-07-27 MED ORDER — MONTELUKAST SODIUM 10 MG PO TABS: 10.0000 mg | ORAL_TABLET | Freq: Every day | ORAL | 5 refills | Status: AC

## 2021-07-27 MED ORDER — LEVOCETIRIZINE DIHYDROCHLORIDE 5 MG PO TABS: 5.0000 mg | ORAL_TABLET | Freq: Two times a day (BID) | ORAL | 5 refills | Status: DC

## 2021-07-27 MED ORDER — ALBUTEROL SULFATE HFA 108 (90 BASE) MCG/ACT IN AERS: 2.0000 | INHALATION_SPRAY | RESPIRATORY_TRACT | 2 refills | Status: AC | PRN

## 2021-07-27 NOTE — Patient Instructions (Addendum)
1. Exercise-induced bronchospasm ?- Lung testing looked bad. ?- Start the prednisone pack ?- We are going to restart the montelukast to see if this will help with the shortness of breath.  ?- Daily controller medication(s): Singulair 10mg  daily ?- Prior to physical activity: albuterol 2 puffs 10-15 minutes before physical activity. ?- Rescue medications: albuterol 4 puffs every 4-6 hours as needed ?- Asthma control goals:  ?* Full participation in all desired activities (may need albuterol before activity) ?* Albuterol use two time or less a week on average (not counting use with activity) ?* Cough interfering with sleep two time or less a month ?* Oral steroids no more than once a year ?* No hospitalizations ?  ?2. Chronic urticaria - controlled with antihistamines ?- Workup last year was normal aside from the tree nut allergies. ?- In the meantime, continue with suppressive dosing of antihistamines:  ?              - Morning: Xyzal (levocetirizine) 5mg  (one tablet) ?              - Evening: Xyzal (levocetirizine) 5mg  (one tablet) ?- You can change this dosing at home, decreasing the dose as needed or increasing the dosing as needed.  ?- If you are not tolerating the medications or are tired of taking them every day, we can start treatment with a monthly injectable medication called Xolair.  ?  ?3. Perennial and seasonal allergic rhinitis (trees, weeds, grasses, dust mites, cat and cockroach) ?- We are restarting the Xyzal to see if this helps.  ?- Allergy shots would also help. ?- Continue taking: Xyzal 5 mg twice daily ?- You can use an extra dose of the antihistamine, if needed, for breakthrough symptoms.  ?- Consider nasal saline rinses 1-2 times daily to remove allergens from the nasal cavities as well as help with mucous clearance (this is especially helpful to do before the nasal sprays are given) ?- Consider allergy shots as a means of long-term control. ?- Allergy shots "re-train" and "reset" the immune  system to ignore environmental allergens and decrease the resulting immune response to those allergens (sneezing, itchy watery eyes, runny nose, nasal congestion, etc).    ?- Allergy shots improve symptoms in 75-85% of patients.  ? ?4. Return in about 6 months (around 01/26/2022).  ? ? ?Please inform us of any Emergency Department visits, hospitalizations, or changes in symptoms. Call us before going to the ED for breathing or allergy symptoms since we might be able to fit you in for a sick visit. Feel free to contact us anytime with any questions, problems, or concerns. ? ?It was a pleasure to see you again today! ? ?Websites that have reliable patient information: ?1. American Academy of Asthma, Allergy, and Immunology: www.aaaai.org ?2. Food Allergy Research and Education (FARE): foodallergy.org ?3. Mothers of Asthmatics: http://www.asthmacommunitynetwork.org ?4. [employer] of Allergy, Asthma, and Immunology: MonthlyElectricBill.co.uk ? ? ?COVID-19 Vaccine Information can be found at: ShippingScam.co.uk For questions related to vaccine distribution or appointments, please email [email or web address] or call [phone].  ? ?We realize that you might be concerned about having an allergic reaction to the COVID19 vaccines. To help with that concern, WE ARE OFFERING THE COVID19 VACCINES IN OUR OFFICE! Ask the front desk for dates!  ? ? ? ??Like? [place] on Facebook and Instagram for our latest updates!  ?  ? ? ?A healthy democracy works best when [employer] participate! Make sure you are registered to vote! If you have moved  or changed any of your contact information, you will need to get this updated before voting! ? ?In some cases, you MAY be able to register to vote online: [email or web address] ? ? ? ? ? ? ? ? ? ?

## 2021-07-27 NOTE — Progress Notes (Signed)
? ?FOLLOW UP ? ?Date of Service/Encounter:  07/28/21 ? ? ?Assessment:  ? ?Exercise-induced bronchospasm ?  ?Chronic urticaria ?  ?Perennial and seasonal allergic rhinitis (trees, weeds, grasses, dust mites, cat and cockroach) ?  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  ?GERD vs EoE ? ?Plan/Recommendations:  ? ?1. Exercise-induced bronchospasm ?- Lung testing looked bad. ?- Start the prednisone pack ?- We are going to restart the montelukast to see if this will help with the shortness of breath.  ?- Daily controller medication(s): Singulair 10mg  daily ?- Prior to physical activity: albuterol 2 puffs 10-15 minutes before physical activity. ?- Rescue medications: albuterol 4 puffs every 4-6 hours as needed ?- Asthma control goals:  ?* Full participation in all desired activities (may need albuterol before activity) ?* Albuterol use two time or less a week on average (not counting use with activity) ?* Cough interfering with sleep two time or less a month ?* Oral steroids no more than once a year ?* No hospitalizations ?  ?2. Chronic urticaria - controlled with antihistamines ?- Workup in 2019 was normal aside from the tree nut allergies. ?- In the meantime, continue with suppressive dosing of antihistamines:  ?              - Morning: Xyzal (levocetirizine) 5mg  (one tablet) ?              - Evening: Xyzal (levocetirizine) 5mg  (one tablet) ?- You can change this dosing at home, decreasing the dose as needed or increasing the dosing as needed.  ?- If you are not tolerating the medications or are tired of taking them every day, we can start treatment with a monthly injectable medication called Xolair.  ?  ?3. Perennial and seasonal allergic rhinitis (trees, weeds, grasses, dust mites, cat and cockroach) ?- We are restarting the Xyzal to see if this helps.  ?- Allergy shots would also help. ?- Continue taking: Xyzal 5 mg twice daily ?- You can use an extra dose of the antihistamine, if needed, for breakthrough symptoms.  ?-  Consider nasal saline rinses 1-2 times daily to remove allergens from the nasal cavities as well as help with mucous clearance (this is especially helpful to do before the nasal sprays are given) ?- Consider allergy shots as a means of long-term control. ?- Allergy shots "re-train" and "reset" the immune system to ignore environmental allergens and decrease the resulting immune response to those allergens (sneezing, itchy watery eyes, runny nose, nasal congestion, etc).    ?- Allergy shots improve symptoms in 75-85% of patients.  ? ?4. Return in about 6 months (around 01/26/2022).  ? ?Subjective:  ? ?Tashica Hutch is a 21 y.o. female presenting today for follow up of  ?Chief Complaint  ?Patient presents with  ? Allergic Rhinitis   ?  Bad congestion,sneezing, itching, hacking cough (maxed out on allergy medicine yesterday). Last year the allergies turned into a sinus infection. Wants to get a handle on it before it turns into another infection.  ? Cough  ? Urticaria  ?  Still having random outbreaks  ? ? ?Peyson Wilking has a history of the following: ?Patient Active Problem List  ? Diagnosis Date Noted  ? Exercise-induced bronchospasm 06/30/2017  ? Chronic urticaria 06/30/2017  ? Seasonal and perennial allergic rhinitis 06/30/2017  ? Frequent headaches 10/20/2013  ? ? ?History obtained from: chart review and patient. ? ?Britnee is a 21 y.o. female presenting for a follow up visit.  She was last seen  in July 2020.  At that time, great.  We recommended restarting montelukast to see if this would help with the shortness of breath episodes.  We will continue with albuterol as needed.  Her urticaria was under good control with this has on the morning and Zyrtec at night.  For her rhinitis, we recommended restarting the nose spray and being consistent with that.  We also continued with antihistamines for her hives.  We did discuss allergen immunotherapy.  She was endorsing some chest discomfort which we recommended treating with  following up with her GI doctor. ? ?In the interim, she moved to [place]. 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 She never did see an allergist there. She is doing Glass blower/designer for [employer] and is planning to apply for law school after that stint ? ?Asthma/Respiratory Symptom History: Asthma has been fairly well controlled. She does report that she struggled with the spirometry today. This is mostly congestion and a hacking cough. This was similar to last year and she would get get a URI that remained for one month. She does not remember if she got antibiotics for this. It lasted for a while.  She is having similar symptoms now. ? ?Allergic Rhinitis Symptom History: She has been using cetirizine and sinus medications around the clock. She reports that she wakes up dry and scratchy. She has had some chest pain and a cough today. She is blowing her nose constantly.  She has been doing only the Zyrtec or OTC sinus medication. She is not using a nose spray consistently. She does not have a cat and is not around them much. Se was mostly fine during the winter months. Allegra made her throat dry. Zyrtec is no longer working. Xyzal she does not remember. She doesn ot remember the last time that she took Singulair.  ? ?Skin Symptom History: She is better from a hive perspective. She broke out in hives from Wellbutrin after two days; she stopped taking this. She broke out in hives across her face.  She has been having random breakouts and on her chest and neck. This is not a full blown hives, but instead a welt. She esitmates that these are a problem a couple of days per month.  ? ?She grduated from [employer] with a degree in Psychology. She is interested in Therapist, nutritional.  She has a best friend who attends law school emergency that she just visited her this past week.  She is hoping to get into school there. ? ?Otherwise, there have been no changes to her past medical history, surgical history, family history, or social  history. ? ? ? ?Review of Systems  ?Constitutional: Negative.  Negative for fever, malaise/fatigue and weight loss.  ?HENT:  Positive for congestion. Negative for ear discharge and ear pain.   ?Eyes:  Negative for pain, discharge and redness.  ?Respiratory:  Negative for cough, sputum production, shortness of breath and wheezing.   ?Cardiovascular: Negative.  Negative for chest pain and palpitations.  ?Gastrointestinal:  Negative for abdominal pain, heartburn, nausea and vomiting.  ?Skin:  Positive for itching. Negative for rash.  ?Neurological:  Negative for dizziness and headaches.  ?Endo/Heme/Allergies:  Positive for environmental allergies. Does not bruise/bleed easily.   ? ? ? ?Objective:  ? ?Blood pressure 130/72, pulse (!) 107, temperature (!) 97.3 ?F (36.3 ?C), temperature source Temporal, resp. rate 12, height 5\' 6"  (1.676 m), SpO2 98 %. ?Body mass index is 31.31 kg/m?. ? ? ? ?Physical Exam ?Constitutional:   ?  Appearance: She is well-developed.  ?HENT:  ?   Head: Normocephalic and atraumatic.  ?   Right Ear: Tympanic membrane, ear canal and external ear normal. No drainage, swelling or tenderness. Tympanic membrane is not injected, scarred, erythematous, retracted or bulging.  ?   Left Ear: Tympanic membrane, ear canal and external ear normal. No drainage, swelling or tenderness. Tympanic membrane is not injected, scarred, erythematous, retracted or bulging.  ?   Nose: No nasal deformity, septal deviation, mucosal edema or rhinorrhea.  ?   Right Sinus: No maxillary sinus tenderness or frontal sinus tenderness.  ?   Left Sinus: No maxillary sinus tenderness or frontal sinus tenderness.  ?   Mouth/Throat:  ?   Mouth: Mucous membranes are not pale and not dry.  ?   Pharynx: Uvula midline.  ?Eyes:  ?   General:     ?   Right eye: No discharge.     ?   Left eye: No discharge.  ?   Conjunctiva/sclera: Conjunctivae normal.  ?   Right eye: Right conjunctiva is not injected. No chemosis. ?   Left eye: Left  conjunctiva is not injected. No chemosis. ?   Pupils: Pupils are equal, round, and reactive to light.  ?Cardiovascular:  ?   Rate and Rhythm: Normal rate and regular rhythm.  ?   Heart sounds: Normal heart

## 2021-07-28 ENCOUNTER — Encounter: Payer: Self-pay | Admitting: Allergy & Immunology

## 2021-07-28 ENCOUNTER — Telehealth: Payer: Self-pay | Admitting: Allergy & Immunology

## 2021-07-28 ENCOUNTER — Other Ambulatory Visit: Payer: Self-pay | Admitting: *Deleted

## 2021-07-28 NOTE — Telephone Encounter (Signed)
Called and spoke with pharmacy and they stated it needs a PA since it is 2 times daily. I called and advised patient that I will work on this and keep her updated. Patient verbalized understanding and would like medication to be sent to [facility].  ?

## 2021-07-28 NOTE — Telephone Encounter (Signed)
Afv, cma is working on pa for this see telephone message in chart ?

## 2021-07-28 NOTE — Telephone Encounter (Signed)
Patient called and said that she received a prescription yesterday from Dr Dellis Anes for Levocetirizine. She stated that when she went to the pharmacy to get it the pharmacy told her it was sent back. I asked her where it was sent to and she stated that they asked about an alternative drug and didn't give her much more information. [facility] [address] Patient requests a call back at [phone]. ?

## 2021-07-29 ENCOUNTER — Other Ambulatory Visit: Payer: Self-pay | Admitting: *Deleted

## 2021-07-29 MED ORDER — LEVOCETIRIZINE DIHYDROCHLORIDE 5 MG PO TABS: 5.0000 mg | ORAL_TABLET | Freq: Two times a day (BID) | ORAL | 5 refills | Status: DC | PRN

## 2021-07-29 NOTE — Telephone Encounter (Signed)
Prescription has been sent in to requested pharmacy. PA has been submitted through CoverMyMeds for Levocetirizine and is currently pending approval/denial.  ?

## 2021-07-30 NOTE — Telephone Encounter (Signed)
PA is still pending.  

## 2021-08-02 NOTE — Telephone Encounter (Signed)
PA has been re-submitted through CoverMyMeds for Levocetirizine and is currently pending approval/denial.  ?

## 2021-08-02 NOTE — Telephone Encounter (Signed)
PA was cancelled but did not state why, will resubmit.  ?

## 2021-08-04 NOTE — Telephone Encounter (Signed)
PA was denied stating that the patient does not have active coverage. Will call patient to discuss further.  ?

## 2021-11-06 ENCOUNTER — Other Ambulatory Visit: Payer: Self-pay | Admitting: Allergy & Immunology

## 2022-08-08 ENCOUNTER — Other Ambulatory Visit: Payer: Self-pay | Admitting: Allergy & Immunology

## 2022-10-17 ENCOUNTER — Other Ambulatory Visit: Payer: Self-pay | Admitting: Allergy & Immunology
